# Patient Record
Sex: Female | Born: 1995 | Race: Black or African American | Marital: Single
Health system: Southern US, Community
[De-identification: ages and names within clinical notes are randomized; demographics above are authoritative.]

---
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
## 2016-09-04 DIAGNOSIS — S8990XD Unspecified injury of unspecified lower leg, subsequent encounter: Secondary | ICD-10-CM | POA: Diagnosis not present

## 2016-09-05 DIAGNOSIS — S8990XD Unspecified injury of unspecified lower leg, subsequent encounter: Secondary | ICD-10-CM | POA: Diagnosis not present

## 2016-09-06 DIAGNOSIS — S8990XD Unspecified injury of unspecified lower leg, subsequent encounter: Secondary | ICD-10-CM | POA: Diagnosis not present

## 2016-09-07 DIAGNOSIS — S8990XD Unspecified injury of unspecified lower leg, subsequent encounter: Secondary | ICD-10-CM | POA: Diagnosis not present

## 2016-09-08 DIAGNOSIS — S8990XD Unspecified injury of unspecified lower leg, subsequent encounter: Secondary | ICD-10-CM | POA: Diagnosis not present

## 2016-09-16 DIAGNOSIS — S8990XD Unspecified injury of unspecified lower leg, subsequent encounter: Secondary | ICD-10-CM | POA: Diagnosis not present

## 2016-09-17 DIAGNOSIS — S8990XD Unspecified injury of unspecified lower leg, subsequent encounter: Secondary | ICD-10-CM | POA: Diagnosis not present

## 2016-09-19 DIAGNOSIS — S8990XD Unspecified injury of unspecified lower leg, subsequent encounter: Secondary | ICD-10-CM | POA: Diagnosis not present

## 2016-09-20 DIAGNOSIS — S8990XD Unspecified injury of unspecified lower leg, subsequent encounter: Secondary | ICD-10-CM | POA: Diagnosis not present

## 2016-09-21 DIAGNOSIS — S8990XD Unspecified injury of unspecified lower leg, subsequent encounter: Secondary | ICD-10-CM | POA: Diagnosis not present

## 2016-09-23 DIAGNOSIS — S8990XD Unspecified injury of unspecified lower leg, subsequent encounter: Secondary | ICD-10-CM | POA: Diagnosis not present

## 2016-09-25 DIAGNOSIS — S8990XD Unspecified injury of unspecified lower leg, subsequent encounter: Secondary | ICD-10-CM | POA: Diagnosis not present

## 2016-09-26 DIAGNOSIS — S8990XD Unspecified injury of unspecified lower leg, subsequent encounter: Secondary | ICD-10-CM | POA: Diagnosis not present

## 2016-09-27 DIAGNOSIS — S8990XD Unspecified injury of unspecified lower leg, subsequent encounter: Secondary | ICD-10-CM | POA: Diagnosis not present

## 2016-10-03 DIAGNOSIS — M222X2 Patellofemoral disorders, left knee: Secondary | ICD-10-CM | POA: Diagnosis not present

## 2016-10-03 DIAGNOSIS — S8990XD Unspecified injury of unspecified lower leg, subsequent encounter: Secondary | ICD-10-CM | POA: Diagnosis not present

## 2016-10-04 DIAGNOSIS — M222X2 Patellofemoral disorders, left knee: Secondary | ICD-10-CM | POA: Diagnosis not present

## 2016-10-04 DIAGNOSIS — S8990XD Unspecified injury of unspecified lower leg, subsequent encounter: Secondary | ICD-10-CM | POA: Diagnosis not present

## 2016-10-05 DIAGNOSIS — M222X2 Patellofemoral disorders, left knee: Secondary | ICD-10-CM | POA: Diagnosis not present

## 2016-10-09 DIAGNOSIS — M222X2 Patellofemoral disorders, left knee: Secondary | ICD-10-CM | POA: Diagnosis not present

## 2016-12-30 DIAGNOSIS — S8990XD Unspecified injury of unspecified lower leg, subsequent encounter: Secondary | ICD-10-CM | POA: Diagnosis not present

## 2016-12-31 DIAGNOSIS — S8990XD Unspecified injury of unspecified lower leg, subsequent encounter: Secondary | ICD-10-CM | POA: Diagnosis not present

## 2017-01-02 DIAGNOSIS — S8990XD Unspecified injury of unspecified lower leg, subsequent encounter: Secondary | ICD-10-CM | POA: Diagnosis not present

## 2017-01-03 DIAGNOSIS — S8990XD Unspecified injury of unspecified lower leg, subsequent encounter: Secondary | ICD-10-CM | POA: Diagnosis not present

## 2017-01-05 DIAGNOSIS — S8990XD Unspecified injury of unspecified lower leg, subsequent encounter: Secondary | ICD-10-CM | POA: Diagnosis not present

## 2017-01-09 DIAGNOSIS — S8990XD Unspecified injury of unspecified lower leg, subsequent encounter: Secondary | ICD-10-CM | POA: Diagnosis not present

## 2017-04-03 DIAGNOSIS — A64 Unspecified sexually transmitted disease: Secondary | ICD-10-CM | POA: Diagnosis not present

## 2017-04-03 DIAGNOSIS — Z113 Encounter for screening for infections with a predominantly sexual mode of transmission: Secondary | ICD-10-CM | POA: Diagnosis not present

## 2017-09-20 DIAGNOSIS — Z23 Encounter for immunization: Secondary | ICD-10-CM | POA: Diagnosis not present

## 2017-11-03 ENCOUNTER — Telehealth (INDEPENDENT_AMBULATORY_CARE_PROVIDER_SITE_OTHER): Payer: Self-pay | Admitting: Radiology

## 2017-11-03 DIAGNOSIS — M25372 Other instability, left ankle: Secondary | ICD-10-CM

## 2017-11-03 DIAGNOSIS — S99912A Unspecified injury of left ankle, initial encounter: Secondary | ICD-10-CM

## 2017-11-03 DIAGNOSIS — M25572 Pain in left ankle and joints of left foot: Secondary | ICD-10-CM

## 2017-11-03 NOTE — Telephone Encounter (Signed)
I left voicemail for patient and reached out to her trainer. Advised that MRI was approved for her ankle and [facility] would be calling to schedule. Also gave their office contact information if she doesn't hear anything and she can set up an appointment that works best for her schedule.

## 2017-11-03 NOTE — Telephone Encounter (Signed)
Patient is [employer], status post left ankle sprain, suspected osteochondral defect. MRI left ankle to r.o defect.

## 2017-11-03 NOTE — Telephone Encounter (Signed)
MRI has been approved through insurance. Patient will receive call from [facility] to schedule.

## 2017-11-07 ENCOUNTER — Ambulatory Visit
Admission: RE | Admit: 2017-11-07 | Discharge: 2017-11-07 | Disposition: A | Discharge status: Home / Self Care | Payer: BLUE CROSS/BLUE SHIELD | Source: Ambulatory Visit | Attending: Orthopedic Surgery | Admitting: Orthopedic Surgery

## 2017-11-07 DIAGNOSIS — M25572 Pain in left ankle and joints of left foot: Secondary | ICD-10-CM

## 2017-11-07 DIAGNOSIS — S93492A Sprain of other ligament of left ankle, initial encounter: Secondary | ICD-10-CM | POA: Diagnosis not present

## 2017-11-07 DIAGNOSIS — S99912A Unspecified injury of left ankle, initial encounter: Secondary | ICD-10-CM

## 2017-11-07 DIAGNOSIS — M25372 Other instability, left ankle: Secondary | ICD-10-CM

## 2017-11-12 ENCOUNTER — Inpatient Hospital Stay: Admission: RE | Admit: 2017-11-12 | Payer: Self-pay | Source: Ambulatory Visit

## 2017-11-21 DIAGNOSIS — L0501 Pilonidal cyst with abscess: Secondary | ICD-10-CM | POA: Diagnosis not present

## 2017-11-23 DIAGNOSIS — L0501 Pilonidal cyst with abscess: Secondary | ICD-10-CM | POA: Diagnosis not present

## 2017-11-24 DIAGNOSIS — L0501 Pilonidal cyst with abscess: Secondary | ICD-10-CM | POA: Diagnosis not present

## 2017-12-29 DIAGNOSIS — L0591 Pilonidal cyst without abscess: Secondary | ICD-10-CM | POA: Diagnosis not present

## 2018-07-30 ENCOUNTER — Ambulatory Visit: Payer: BLUE CROSS/BLUE SHIELD | Admitting: Sports Medicine

## 2018-08-01 ENCOUNTER — Ambulatory Visit
Admission: RE | Admit: 2018-08-01 | Discharge: 2018-08-01 | Disposition: A | Discharge status: Home / Self Care | Payer: BLUE CROSS/BLUE SHIELD | Source: Ambulatory Visit | Attending: Family Medicine | Admitting: Family Medicine

## 2018-08-01 ENCOUNTER — Ambulatory Visit (INDEPENDENT_AMBULATORY_CARE_PROVIDER_SITE_OTHER): Payer: BLUE CROSS/BLUE SHIELD | Admitting: Family Medicine

## 2018-08-01 VITALS — BP 104/72 | Ht 71.0 in | Wt 170.0 lb

## 2018-08-01 DIAGNOSIS — M722 Plantar fascial fibromatosis: Secondary | ICD-10-CM

## 2018-08-01 DIAGNOSIS — G8929 Other chronic pain: Secondary | ICD-10-CM | POA: Diagnosis not present

## 2018-08-01 DIAGNOSIS — M25572 Pain in left ankle and joints of left foot: Secondary | ICD-10-CM | POA: Diagnosis not present

## 2018-08-01 NOTE — Patient Instructions (Signed)
Get x-rays after you leave today - we will call you with the results. If you do have a nonunion medial malleolus fracture, given you're doing well, I'd recommend addressing this at the end of the season. If it bothers you enough in-season you may have to have this addressed.  You have plantar fasciitis Take tylenol and/or aleve as needed for pain  Plantar fascia stretch for 20-30 seconds (do 3 of these) in morning Lowering/raise on a step exercises 3 x 10 once or twice a day - this is very important for long term recovery. Can add heel walks, toe walks forward and backward as well Ice heel for 15 minutes as needed. Avoid flat shoes/barefoot walking as much as possible. Arch straps have been shown to help with pain. Inserts are important (dr. Jari Sportsmanscholls active series, spencos, our green insoles, custom orthotics). Steroid injection is a consideration for short term pain relief if you are struggling. Physical therapy is also an option. Follow up will depend on the x-ray results.

## 2018-08-02 ENCOUNTER — Encounter: Payer: Self-pay | Admitting: Family Medicine

## 2018-08-02 DIAGNOSIS — D72819 Decreased white blood cell count, unspecified: Secondary | ICD-10-CM | POA: Diagnosis not present

## 2018-08-02 DIAGNOSIS — L309 Dermatitis, unspecified: Secondary | ICD-10-CM | POA: Diagnosis not present

## 2018-08-02 NOTE — Progress Notes (Signed)
PCP: Patient, No Pcp Per  Subjective:   HPI: Patient is a 22 y.o. female here for left foot/ankle pain.  Patient reports in December 2018 she suffered inversion injury to left ankle when playing basketball. There was concern of a possible OCD - she had MRI that showed ATFL tear, sprain of PTFL and calcaneofibular ligaments, along with edema medial malleolus and talus, moderate peroneal tendinopathy.   She feels she improved but never completely got better. She used ASO, rehabbed with Event organ[employer]. She continues to have tenderness medial malleolus with pain in plantar heel, sharp. Able to practice at [facility], not limping. Worse in morning. Getting massage to plantar fascia. No skin changes, numbness.  History reviewed. No pertinent past medical history.  Current Outpatient Medications on File Prior to Visit  Medication Sig Dispense Refill  . Azelastine-Fluticasone 137-50 MCG/ACT SUSP Place into the nose.    Marland Kitchen FLOVENT HFA 110 MCG/ACT inhaler   6  . PROAIR HFA 108 (90 Base) MCG/ACT inhaler TAKE 2 PUFFS Q 4 H PRN  6   No current facility-administered medications on file prior to visit.     History reviewed. No pertinent surgical history.  No Known Allergies  Social History   Socioeconomic History  . Marital status: Single    Spouse name: Not on file  . Number of children: Not on file  . Years of education: Not on file  . Highest education level: Not on file  Occupational History  . Not on file  Social Needs  . Financial resource strain: Not on file  . Food insecurity:    Worry: Not on file    Inability: Not on file  . Transportation needs:    Medical: Not on file    Non-medical: Not on file  Tobacco Use  . Smoking status: Not on file  Substance and Sexual Activity  . Alcohol use: Not on file  . Drug use: Not on file  . Sexual activity: Not on file  Lifestyle  . Physical activity:    Days per week: Not on file    Minutes per session: Not on file  . Stress: Not  on file  Relationships  . Social connections:    Talks on phone: Not on file    Gets together: Not on file    Attends religious service: Not on file    Active member of club or organization: Not on file    Attends meetings of clubs or organizations: Not on file    Relationship status: Not on file  . Intimate partner violence:    Fear of current or ex partner: Not on file    Emotionally abused: Not on file    Physically abused: Not on file    Forced sexual activity: Not on file  Other Topics Concern  . Not on file  Social History Narrative  . Not on file    History reviewed. No pertinent family history.  BP 104/72   Ht 5\' 11"  (1.803 m)   Wt 170 lb (77.1 kg)   LMP 07/28/2018   BMI 23.71 kg/m   Review of Systems: See HPI above.     Objective:  Physical Exam:  Gen: NAD, comfortable in exam room  Left foot/ankle: Mod pronation long arches.  No gross deformity, swelling, ecchymoses FROM with 5/5 strength. TTP mildly tip of medial malleolus.  TTP plantar fascia at insertion on medial calcaneus. Negative ant drawer and talar tilt.   Negative syndesmotic compression. Negative calcaneal squeeze. Thompsons  test negative. NV intact distally.  Right foot/ankle: Mod pronation long arches.  No deformity. FROM with 5/5 strength. No tenderness to palpation. NVI distally.   Assessment & Plan:  1. Left foot/ankle pain - brief MSK u/s shows small possible nonunion avulsion off medial malleolus in area of pain.  Independently reviewed radiographs and noted a well corticated fragment here consistent with old avulsion fracture that may have occurred in December.  Advised patient to treat this conservatively at this point - most of pain is due to plantar fasciitis though she's tender at nonunion site also.  Reviewed home exercises to do daily.  Arch binders, sports insoles with scaphoid pads.  Continue treatment with athletic trainer.  Icing, tylenol or aleve if needed.  F/u in 6  weeks.

## 2018-08-23 DIAGNOSIS — D72819 Decreased white blood cell count, unspecified: Secondary | ICD-10-CM | POA: Diagnosis not present

## 2018-09-10 DIAGNOSIS — A630 Anogenital (venereal) warts: Secondary | ICD-10-CM | POA: Diagnosis not present

## 2018-09-10 DIAGNOSIS — Z113 Encounter for screening for infections with a predominantly sexual mode of transmission: Secondary | ICD-10-CM | POA: Diagnosis not present

## 2018-09-10 DIAGNOSIS — Z01411 Encounter for gynecological examination (general) (routine) with abnormal findings: Secondary | ICD-10-CM | POA: Diagnosis not present

## 2018-09-10 DIAGNOSIS — Z114 Encounter for screening for human immunodeficiency virus [HIV]: Secondary | ICD-10-CM | POA: Diagnosis not present

## 2018-09-20 DIAGNOSIS — Z23 Encounter for immunization: Secondary | ICD-10-CM | POA: Diagnosis not present

## 2018-09-25 DIAGNOSIS — N760 Acute vaginitis: Secondary | ICD-10-CM | POA: Diagnosis not present

## 2018-11-22 ENCOUNTER — Encounter: Payer: Self-pay | Admitting: Sports Medicine

## 2018-11-22 ENCOUNTER — Ambulatory Visit (INDEPENDENT_AMBULATORY_CARE_PROVIDER_SITE_OTHER): Payer: BLUE CROSS/BLUE SHIELD | Admitting: Sports Medicine

## 2018-11-22 VITALS — BP 114/72 | Ht 71.0 in | Wt 172.0 lb

## 2018-11-22 DIAGNOSIS — M722 Plantar fascial fibromatosis: Secondary | ICD-10-CM | POA: Diagnosis not present

## 2018-11-22 NOTE — Progress Notes (Signed)
  Charlot Ogata - 22 y.o. female MRN [id number]  Date of birth: Aug 24, 1996    SUBJECTIVE:      Chief Complaint:/ HPI:  22 year old female with possible pleural [employer] with known plantar fasciitis in the left foot.  She presents today for creation of custom orthotics.  Patient has had plantar fascia pain for several months.  1/10 at rest, 7/10 with activity.  She has previously had insoles over a year ago for similar issue.  Recently she has been wearing an arch strap which provides minimal relief.  Her athletic trainer has been doing scraping and stretching.  She also uses a golf ball to stretch the plantar fascia.  She has been able to continue to play and practice although she notes increased pain with this.  She denies any swelling or erythema.  No numbness or tingling reported.  She denies any feeling of weakness.  No skin changes   ROS:     See HPI  PERTINENT  PMH / PSH FH / / SH:  Past Medical, Surgical, Social, and Family History Reviewed & Updated in the EMR.    OBJECTIVE: BP 114/72   Ht 5\' 11"  (1.803 m)   Wt 172 lb (78 kg)   BMI 23.99 kg/m   Physical Exam:  Vital signs are reviewed.  GEN: Alert and oriented, NAD Pulm: Breathing unlabored PSY: normal mood, congruent affect  MSK: Left foot: Inspection:  No obvious bony deformity.  No swelling, erythema, or bruising.  Normal arch Palpation: She does have some mild tenderness at the medial calcaneal tubercle however the majority of her tenderness is approximately 2 cm distal from the calcaneus over the plantar fascia ROM: Full  ROM of the ankle. Normal midfoot flexibility Strength: 5/5 strength ankle in all planes.  Pain with resisted plantarflexion Neurovascular: N/V intact distally in the lower extremity  MSK US: Limited ultrasound of the left foot shows an area of swelling of the plantar fascia approximately 1.5 cm distal to the calcaneus.  There are no hypoechoic changes or evidence of swelling at the insertion on the  calcaneus   ASSESSMENT & PLAN:  1.  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  She will continue the exercises she has been doing with her ATC.  She was also provided with additional exercises and stretches today.  She will follow-up as needed.   Patient was fitted for a : standard, cushioned, semi-rigid orthotic. The orthotic was heated and afterward the patient stood on the orthotic blank positioned on the orthotic stand. The patient was positioned in subtalar neutral position and 10 degrees of ankle dorsiflexion in a weight bearing stance. After completion of molding, a stable base was applied to the orthotic blank. The blank was ground to a stable position for weight bearing. Size: 10 Base: blue EVA Posting: none Additional orthotic padding: none   Total time spent with the patient was 25 minutes with greater than 50% of the time spent in face-to-face consultation discussing orthotic construction, instruction, and sitting. Gait was neutral with orthotics in place. Patient found them to be comfortable. Follow-up as needed.  I was the preceptor for this visit and available for immediate consultation Marsa Arisimothy Ryan Draper, DO

## 2018-11-29 DIAGNOSIS — H93293 Other abnormal auditory perceptions, bilateral: Secondary | ICD-10-CM | POA: Diagnosis not present

## 2018-11-29 DIAGNOSIS — Z0110 Encounter for examination of ears and hearing without abnormal findings: Secondary | ICD-10-CM | POA: Diagnosis not present

## 2018-11-29 DIAGNOSIS — H6191 Disorder of right external ear, unspecified: Secondary | ICD-10-CM | POA: Diagnosis not present

## 2018-12-17 DIAGNOSIS — N939 Abnormal uterine and vaginal bleeding, unspecified: Secondary | ICD-10-CM | POA: Diagnosis not present

## 2019-01-02 DIAGNOSIS — B373 Candidiasis of vulva and vagina: Secondary | ICD-10-CM | POA: Diagnosis not present

## 2019-07-05 DIAGNOSIS — L0291 Cutaneous abscess, unspecified: Secondary | ICD-10-CM | POA: Diagnosis not present

## 2019-08-14 DIAGNOSIS — K610 Anal abscess: Secondary | ICD-10-CM | POA: Diagnosis not present

## 2019-11-19 IMAGING — DX DG ANKLE COMPLETE 3+V*L*
3 series · 3 of 3 positions shown · non-contrast
Comparison: MRI left ankle 11/07/2017.

CLINICAL DATA: Medial left ankle pain since an injury playing
basketball in November 2017. No recent injury.

EXAM:
LEFT ANKLE COMPLETE - 3+ VIEW

[dg ankle complete left (1 of 3)]
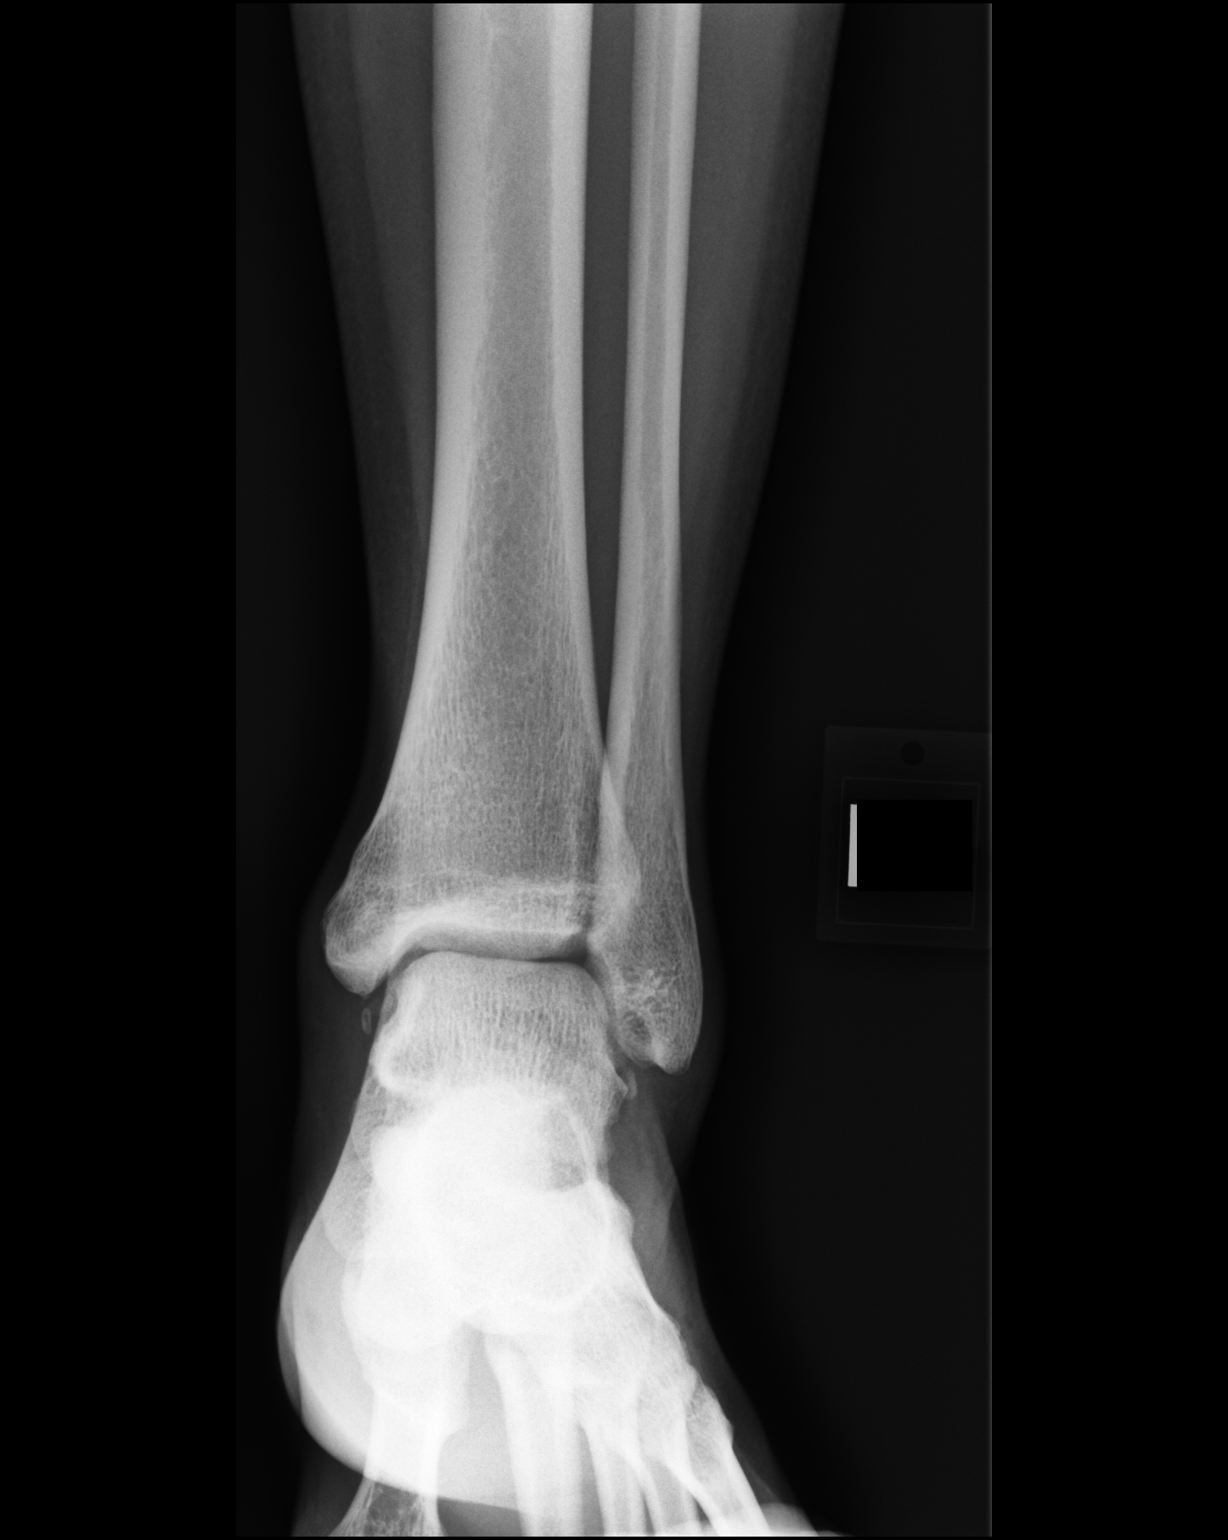

[dg ankle complete left (2 of 3)]
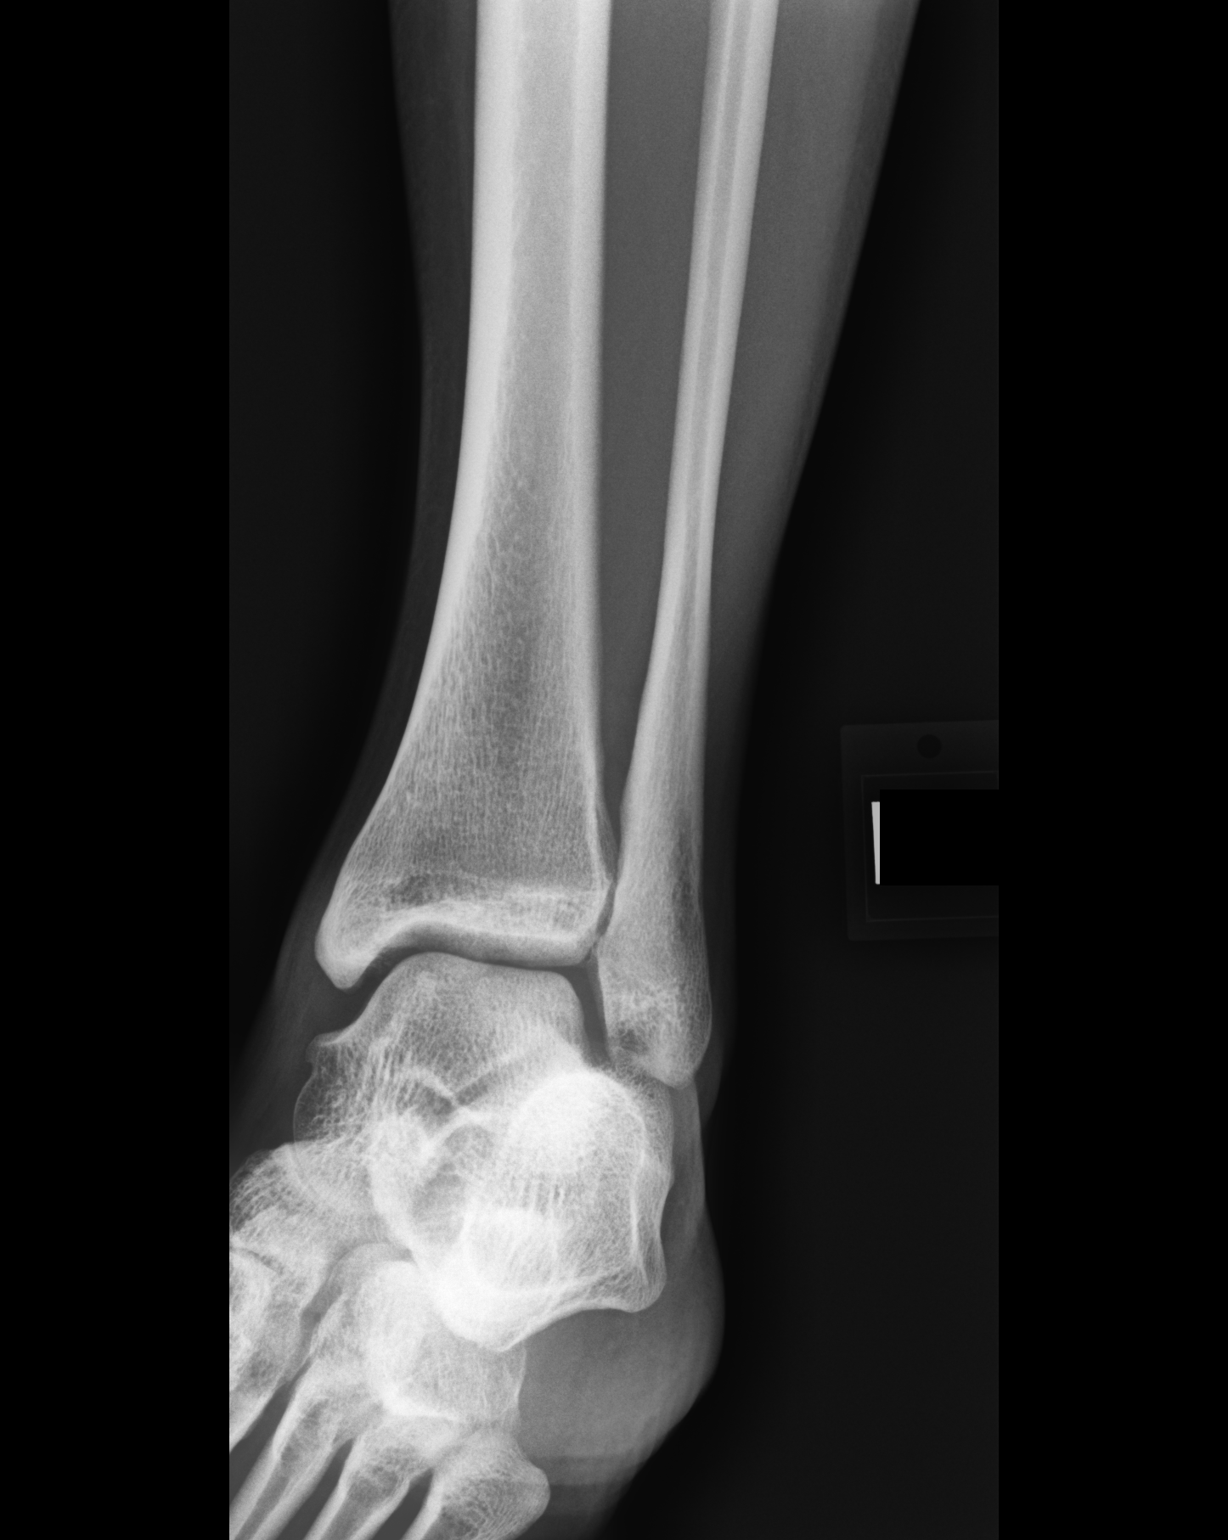

[dg ankle complete left (3 of 3)]
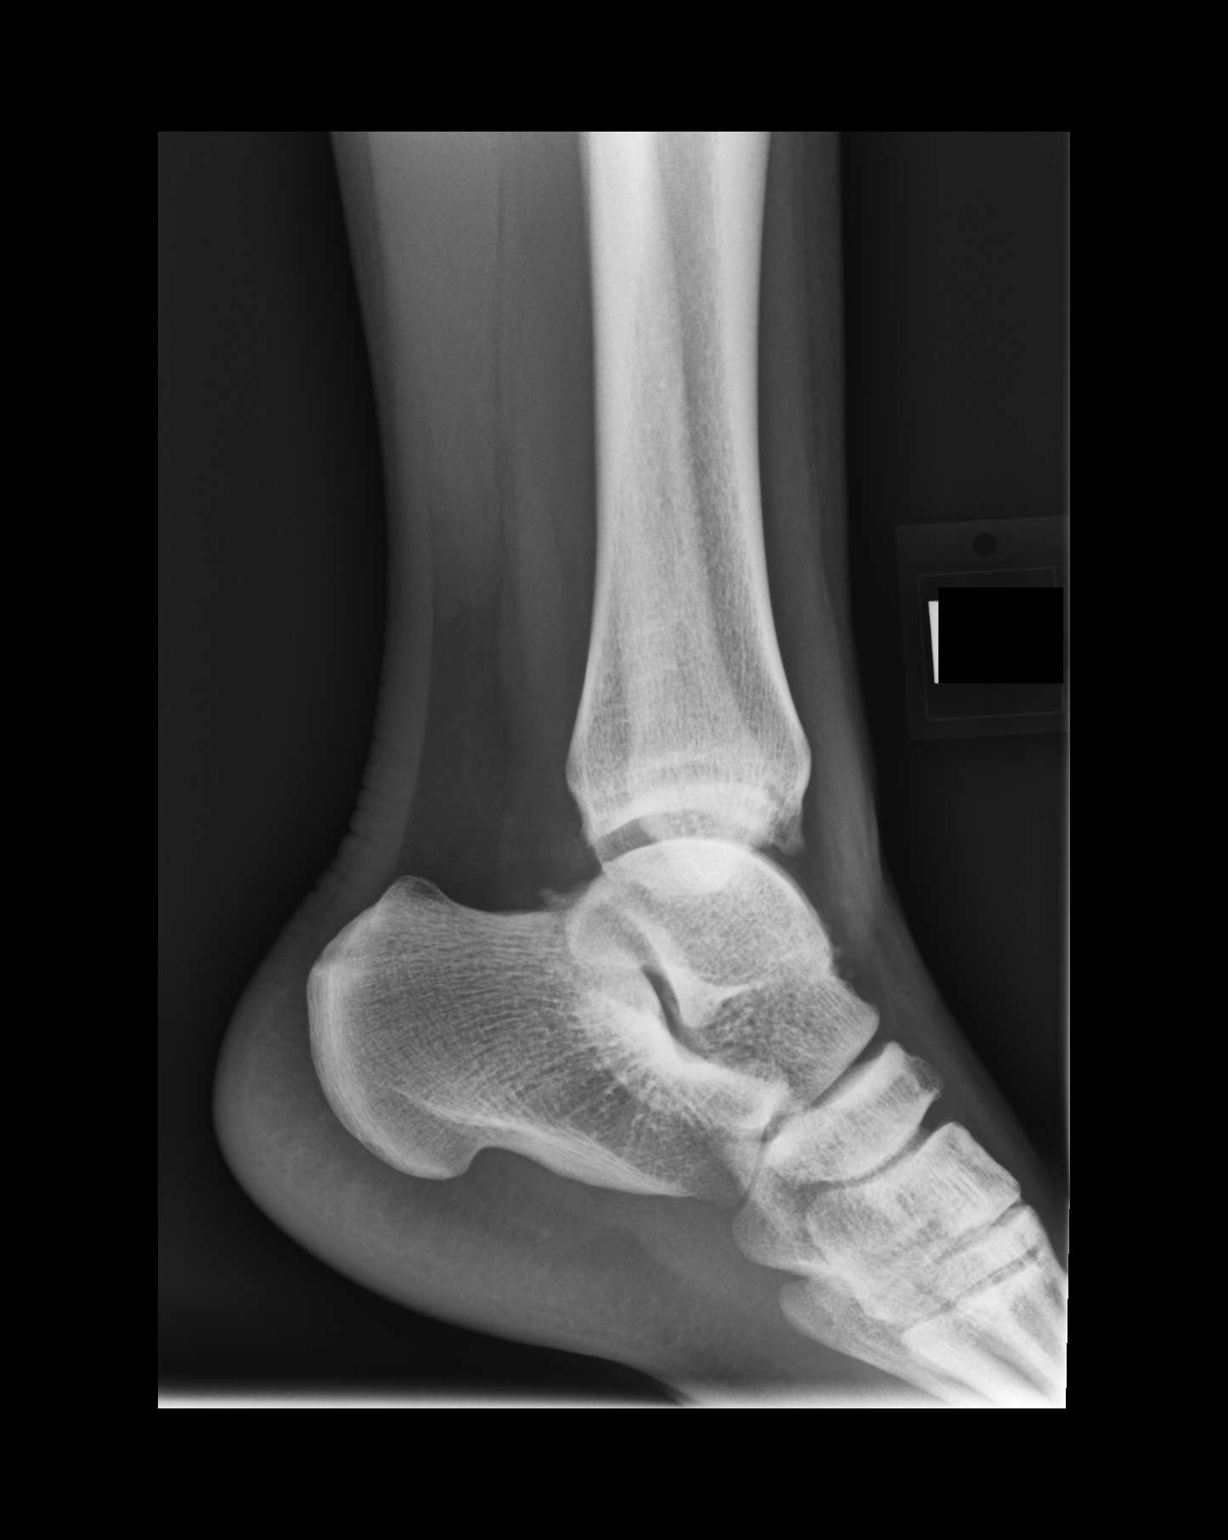

[3 of 3 positions shown; findings below may reference images not displayed]

FINDINGS: No acute bony or joint abnormality is identified. Small well
corticated bone fragments are seen off the medial malleolus and
adjacent to the lateral process of the talus consistent with remote
avulsion injuries. No osteochondral lesion of the talar dome is
identified. Soft tissues appear normal. No joint effusion.
IMPRESSION: No acute abnormality.

Small well corticated fragments off the lateral process of the talus
and medial malleolus are likely due to remote avulsion injuries.

## 2020-02-10 DIAGNOSIS — B373 Candidiasis of vulva and vagina: Secondary | ICD-10-CM | POA: Diagnosis not present

## 2020-02-24 ENCOUNTER — Ambulatory Visit (INDEPENDENT_AMBULATORY_CARE_PROVIDER_SITE_OTHER): Payer: Self-pay | Admitting: Orthopedic Surgery

## 2020-02-24 DIAGNOSIS — M25572 Pain in left ankle and joints of left foot: Secondary | ICD-10-CM

## 2020-02-24 DIAGNOSIS — M25571 Pain in right ankle and joints of right foot: Secondary | ICD-10-CM

## 2020-02-24 DIAGNOSIS — G8929 Other chronic pain: Secondary | ICD-10-CM

## 2020-02-28 ENCOUNTER — Other Ambulatory Visit: Payer: Self-pay

## 2020-02-28 ENCOUNTER — Encounter: Payer: Self-pay | Admitting: Orthopedic Surgery

## 2020-02-28 NOTE — Progress Notes (Signed)
   Post-Op Visit Note   Patient: Sara Jarvis           Date of Birth: 01-19-1996           MRN: [id number] Visit Date: 02/24/2020 PCP: Patient, No Pcp Per   Assessment & Plan:  Chief Complaint: No chief complaint on file.  Visit Diagnoses:  1. Chronic pain of both ankles     Plan: Patient presents for evaluation of bilateral ankle pain.  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  Reports chronic pain in both ankles.  Has a history of rolling the ankles.  Hurts on the lateral side of the right ankle and medial side of the left ankle.  She is able to play.  Takes occasional anti-inflammatories for the problem.  May or may not be playing basketball in the future after graduation.  On exam she has fairly minimal swelling around the ankle joint.  Not much in the way of synovitis.  Palpable intact nontender anterior to posterior to peroneal and Achilles tendons.  No pain with pronation supination of the forefoot.  Pedal pulses intact.  No masses lymphadenopathy or skin changes noted in the ankle region.  Fluoroscopic examination demonstrates about 9 degrees of talar tilt under stress in both ankles.  Anterior drawer equivalent bilaterally.  No subluxation of the peroneal tendons.  Impression is mild laxity in the ankle bilaterally following multiple sprains but no real evidence of talar dome lesion or arthritis.  I would favor observation for now.  I think once the rigors of college basketball past her ankles will likely become less symptomatic.  Currently she does not have much in the way of symptoms with ADLs.  Follow-up as needed  Follow-Up Instructions: No follow-ups on file.   Orders:  No orders of the defined types were placed in this encounter.  No orders of the defined types were placed in this encounter.   Imaging: No results found.  PMFS History: Patient Active Problem List   Diagnosis Date Noted  . Allergic rhinitis 07/31/2013   No past medical history on file.  No  family history on file.  No past surgical history on file. Social History   Occupational History  . Not on file  Tobacco Use  . Smoking status: Not on file  Substance and Sexual Activity  . Alcohol use: Not on file  . Drug use: Not on file  . Sexual activity: Not on file

## 2020-12-23 DIAGNOSIS — R52 Pain, unspecified: Secondary | ICD-10-CM | POA: Diagnosis not present

## 2020-12-28 DIAGNOSIS — Z20822 Contact with and (suspected) exposure to covid-19: Secondary | ICD-10-CM | POA: Diagnosis not present
